# Patient Record
Sex: Male | Born: 2000 | Race: White | Hispanic: No | Marital: Single | Smoking: Never smoker
Health system: Southern US, Community
[De-identification: ages and names within clinical notes are randomized; demographics above are authoritative.]

## PROBLEM LIST (undated history)

## (undated) HISTORY — PX: KNEE SURGERY: SHX244

---

## 2019-05-03 ENCOUNTER — Other Ambulatory Visit: Payer: Self-pay

## 2019-05-03 ENCOUNTER — Encounter (HOSPITAL_COMMUNITY): Payer: Self-pay

## 2019-05-03 ENCOUNTER — Emergency Department (HOSPITAL_COMMUNITY)
Admission: EM | Admit: 2019-05-03 | Discharge: 2019-05-03 | Disposition: A | Discharge status: Home / Self Care | Payer: Managed Care, Other (non HMO) | Attending: Emergency Medicine | Admitting: Emergency Medicine

## 2019-05-03 ENCOUNTER — Emergency Department (HOSPITAL_COMMUNITY): Payer: Managed Care, Other (non HMO)

## 2019-05-03 DIAGNOSIS — S61412A Laceration without foreign body of left hand, initial encounter: Secondary | ICD-10-CM | POA: Diagnosis not present

## 2019-05-03 DIAGNOSIS — Y9289 Other specified places as the place of occurrence of the external cause: Secondary | ICD-10-CM | POA: Insufficient documentation

## 2019-05-03 DIAGNOSIS — Y9389 Activity, other specified: Secondary | ICD-10-CM | POA: Diagnosis not present

## 2019-05-03 DIAGNOSIS — S6992XA Unspecified injury of left wrist, hand and finger(s), initial encounter: Secondary | ICD-10-CM | POA: Diagnosis present

## 2019-05-03 DIAGNOSIS — W278XXA Contact with other nonpowered hand tool, initial encounter: Secondary | ICD-10-CM | POA: Insufficient documentation

## 2019-05-03 DIAGNOSIS — Y990 Civilian activity done for income or pay: Secondary | ICD-10-CM | POA: Insufficient documentation

## 2019-05-03 MED ORDER — LIDOCAINE-EPINEPHRINE (PF) 2 %-1:200000 IJ SOLN
10.0000 mL | Freq: Once | INTRAMUSCULAR | Status: AC
  Administered 2019-05-03: 1 mL via INTRADERMAL
  Filled 2019-05-03: qty 20

## 2019-05-03 MED ORDER — ACETAMINOPHEN 325 MG PO TABS
650.0000 mg | ORAL_TABLET | Freq: Once | ORAL | Status: AC
  Administered 2019-05-03: 650 mg via ORAL
  Filled 2019-05-03: qty 2

## 2019-05-03 NOTE — ED Provider Notes (Signed)
[facility]   CSN: [id number] Arrival date & time: 05/03/19  2020    History   Chief Complaint Chief Complaint  Patient presents with  . Laceration    HPI Daniel Erickson is a 18 y.o. male.     18 y.o male with no PMH presents to the ED with a chief complaint of left hand laceration.  Patient reports working on his car today when he lost control of his wrench, wrench then cut his left dorsum aspect of his left hand.  He reports bleeding was significant at the time, he applied pressure along with irrigated wound.  Patient reports pain with movement, worse with pinky flexion.  Patient reports his last tetanus vaccine was within the past 5 years.  No medical therapy has tried for relieving symptoms.  He denies any other injury or complaint.  The history is provided by the patient.  Laceration Associated symptoms: no fever     History reviewed. No pertinent past medical history.  There are no active problems to display for this patient.   Past Surgical History:  Procedure Laterality Date  . KNEE SURGERY          Home Medications    Prior to Admission medications   Not on File    Family History History reviewed. No pertinent family history.  Social History Social History   Tobacco Use  . Smoking status: Never Smoker  . Smokeless tobacco: Never Used  Substance Use Topics  . Alcohol use: Never    Frequency: Never  . Drug use: Never     Allergies   Patient has no known allergies.   Review of Systems Review of Systems  Constitutional: Negative for fever.  Musculoskeletal: Positive for arthralgias and myalgias. Negative for joint swelling.     Physical Exam Updated Vital Signs BP (!) 142/65   Pulse (!) 106   Temp 100 F (37.8 C) (Oral)   Resp 16   Ht 5\' 8"  (1.727 m)   Wt 81.6 kg   SpO2 100%   BMI 27.37 kg/m   Physical Exam Vitals signs and nursing note reviewed.  Constitutional:    Appearance: He is well-developed.  HENT:     Head: Normocephalic and atraumatic.  Eyes:     General: No scleral icterus.    Pupils: Pupils are equal, round, and reactive to light.  Neck:     Musculoskeletal: Normal range of motion.  Cardiovascular:     Heart sounds: Normal heart sounds.  Pulmonary:     Effort: Pulmonary effort is normal.     Breath sounds: Normal breath sounds. No wheezing.  Chest:     Chest wall: No tenderness.  Abdominal:     General: Bowel sounds are normal. There is no distension.     Palpations: Abdomen is soft.     Tenderness: There is no abdominal tenderness.  Musculoskeletal:        General: No deformity.     Left hand: He exhibits decreased range of motion, tenderness and laceration. He exhibits normal two-point discrimination, normal capillary refill and no deformity. Normal sensation noted. Normal strength noted.       Hands:     Comments: Pulses present, capillary refill is intact.  Strength is 5 out of 5 with hand flexion and extension.  Does have some difficulty flexing left pinky.  Skin:    General: Skin is warm and dry.  Neurological:     Mental Status: He  is alert and oriented to person, place, and time.      ED Treatments / Results  Labs (all labs ordered are listed, but only abnormal results are displayed) Labs Reviewed - No data to display  EKG None  Radiology No results found.  Procedures .Marland Kitchen.Laceration Repair  Date/Time: 05/03/2019 10:11 PM Performed by: Claude MangesSoto, Kerby Hockley, PA-C Authorized by: Claude MangesSoto, Laurisa Sahakian, PA-C   Consent:    Consent obtained:  Verbal   Consent given by:  Patient   Risks discussed:  Infection, poor wound healing, nerve damage, tendon damage, poor cosmetic result and need for additional repair   Alternatives discussed:  No treatment Anesthesia (see MAR for exact dosages):    Anesthesia method:  Local infiltration   Local anesthetic:  Lidocaine 2% WITH epi Laceration details:    Location:  Hand   Hand location:   L hand, dorsum   Length (cm):  2   Depth (mm):  1 Repair type:    Repair type:  Simple Exploration:    Hemostasis achieved with:  Epinephrine   Wound exploration: wound explored through full range of motion and entire depth of wound probed and visualized   Treatment:    Area cleansed with:  Soap and water   Amount of cleaning:  Extensive   Irrigation solution:  Sterile water   Irrigation method:  Pressure wash Skin repair:    Repair method:  Sutures   Suture size:  3-0   Suture material:  Prolene   Suture technique:  Simple interrupted   Number of sutures:  3 Approximation:    Approximation:  Close Post-procedure details:    Dressing:  Non-adherent dressing   Patient tolerance of procedure:  Tolerated well, no immediate complications   (including critical care time)  Medications Ordered in ED Medications  lidocaine-EPINEPHrine (XYLOCAINE W/EPI) 2 %-1:200000 (PF) injection 10 mL (has no administration in time range)  acetaminophen (TYLENOL) tablet 650 mg (has no administration in time range)     Initial Impression / Assessment and Plan / ED Course  I have reviewed the triage vital signs and the nursing notes.  Pertinent labs & imaging results that were available during my care of the patient were reviewed by me and considered in my medical decision making (see chart for details).    Patient with no pertinent past medical history presents to the ED with left hand laceration after fixing his car and losing control of the ranch.  Patient reports pain at the site, worse with flexion of the hand.  During my evaluation his neurovascular intact, does have some decreased left pinky flexion.  Will obtain x-ray to further evaluate any joint involvement.  Patient was also found to have a temp of 100 on arrival, will provide Tylenol.  Tetanus vaccine within 5 years.  X-ray showed no foreign body, soft tissue gas.  I have personally sutured patient's left hand, I have placed 3 sutures of  3-0 Prolene to his left dorsum.  I have also wrapped within a nonadherent dressing.  Patient tolerated the procedure without any problems.  Patient to follow-up as needed with PCP.  He is encouraged to return within 7 to 10 days to have his wound reassessed along with sutures removed.  Patient understands and agrees with management.  Patient stable for discharge.     Portions of this note were generated with Scientist, clinical (histocompatibility and immunogenetics)Dragon dictation software. Dictation errors may occur despite best attempts at proofreading.  Final Clinical Impressions(s) / ED Diagnoses   Final diagnoses:  Laceration of left hand without foreign body, initial encounter    ED Discharge Orders    None       Janeece Fitting, PA-C 05/03/19 [place], [place], DO 05/03/19 2351

## 2019-05-03 NOTE — Discharge Instructions (Addendum)
I have placed 3 sutures to your left hand, please have these removed within 7 to 10 days.  You may alternate ibuprofen or Tylenol to help with your pain.   You may apply Neosporin or bacitracin to your wound.  If you experience any redness, drainage from the wound, worsening symptoms please return to the emergency department.

## 2019-05-03 NOTE — ED Notes (Signed)
Pt dc'd home with all belongings, no narcotics given in ED, drove self home. Pt verbalizes understanding og dc instructions.

## 2019-05-03 NOTE — ED Triage Notes (Signed)
Pt presents to ED with a laceration to Left hand above pinky finger. 2 cm in length. Discomfort with movement, little feeling to area. Pt reports he was working on his truck when the wrench slipped hitting his hand.

## 2020-04-18 IMAGING — DX LEFT HAND - 2 VIEW
2 series · 2 of 2 positions shown · non-contrast
Comparison: None.

CLINICAL DATA: Pain.  Laceration

EXAM:
LEFT HAND - 2 VIEW

[hand pa]
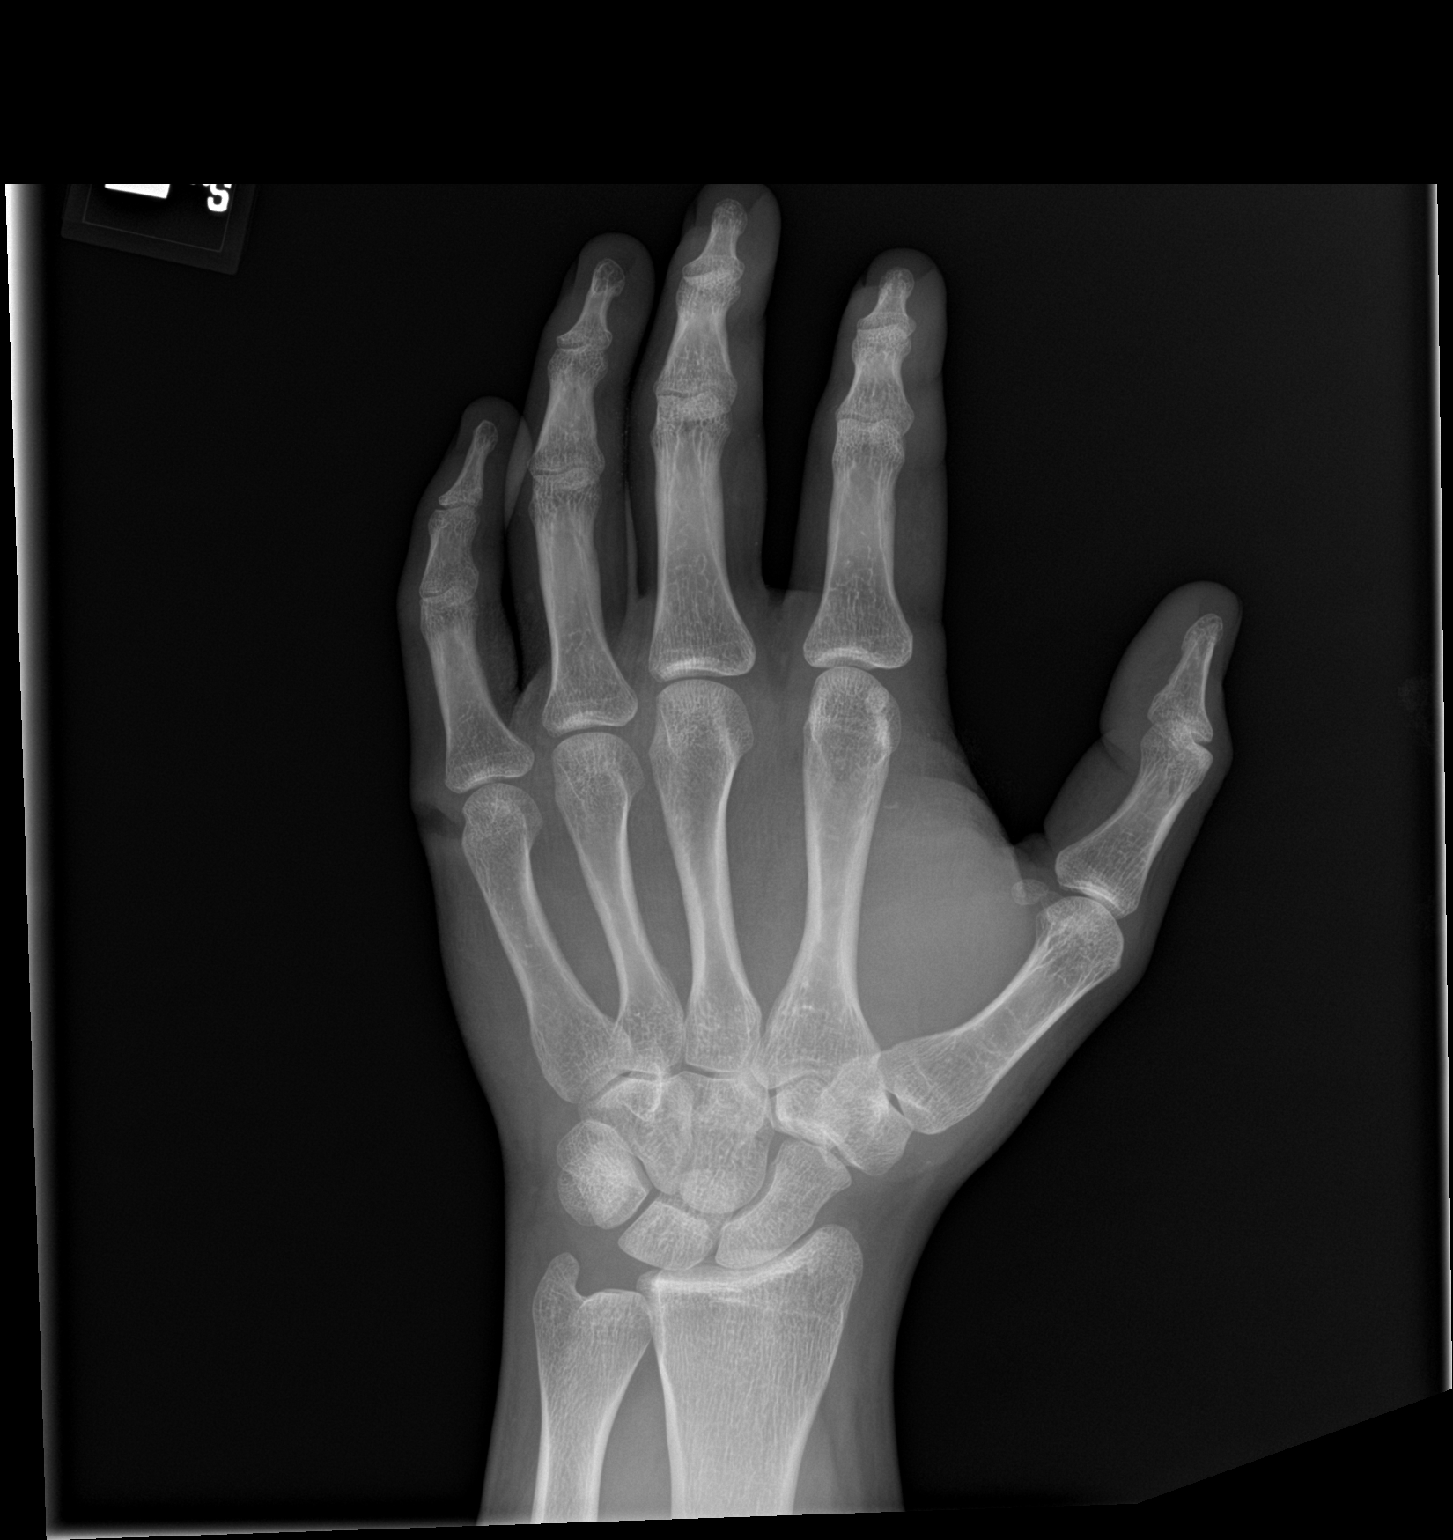

[hand lat]
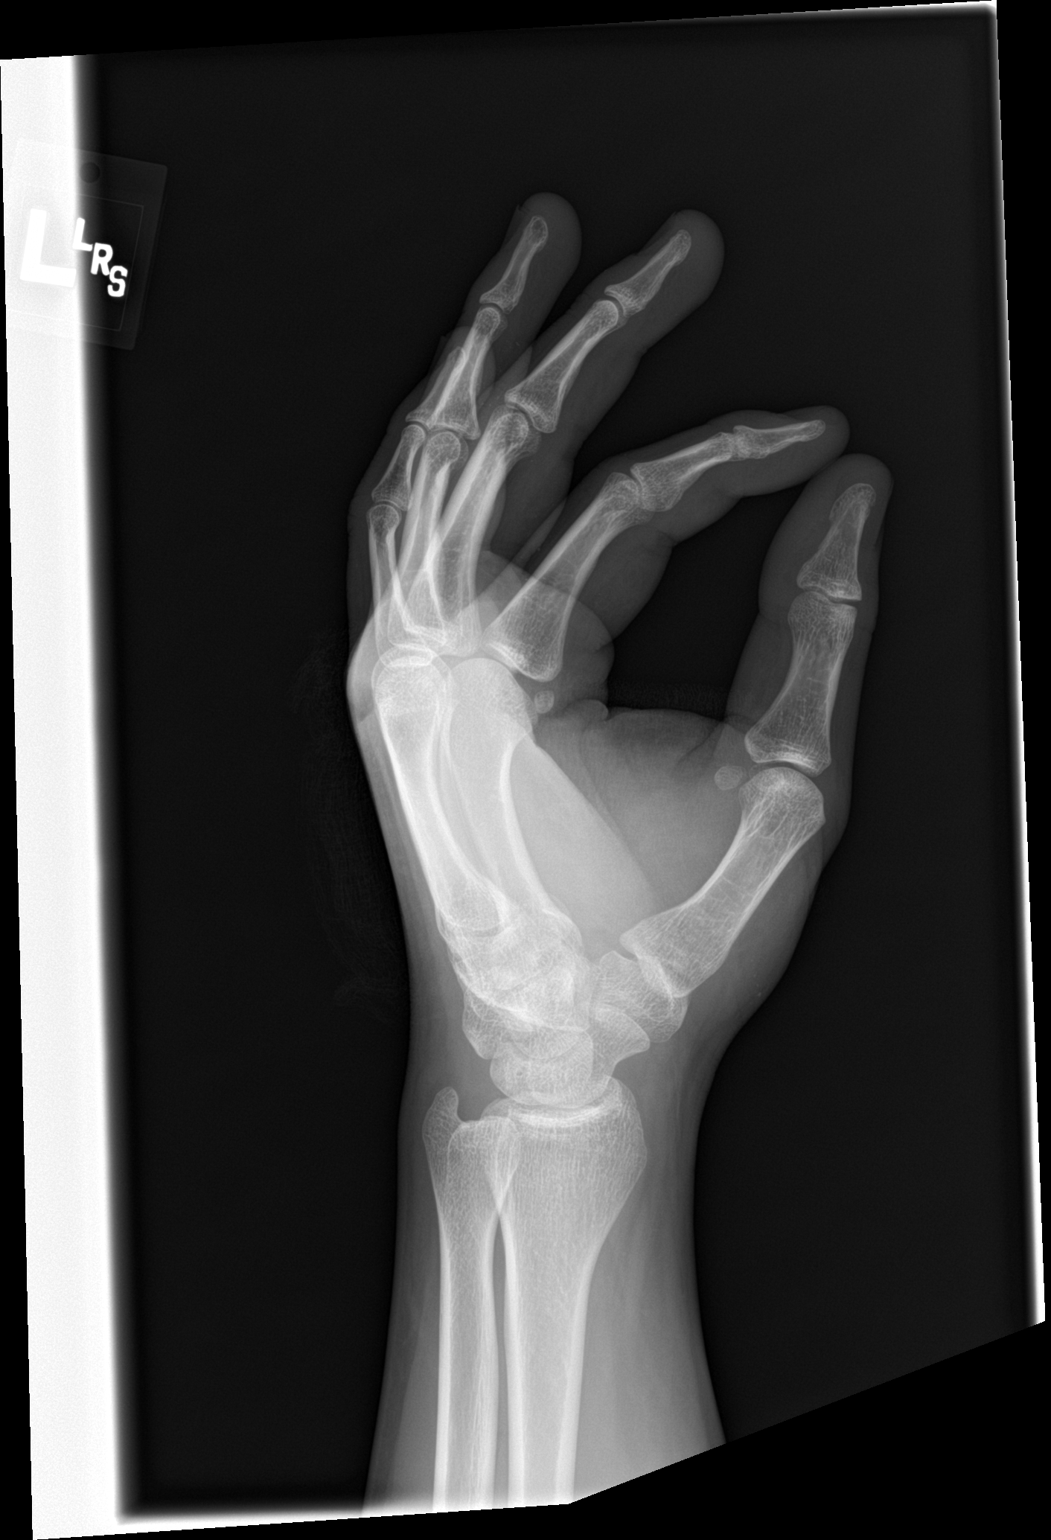

[2 of 2 positions shown; findings below may reference images not displayed]

FINDINGS: There is a soft tissue defect at the level of the fifth metacarpal
head. There is no displaced fracture. No dislocation. No underlying
radiopaque foreign body. There is surrounding soft tissue swelling.
IMPRESSION: 1. No acute osseous abnormality.
2. No radiopaque foreign body.
3. Laceration at the level of the fifth metacarpal head.
